# Patient Record
Sex: Female | Born: 1995 | Race: White | Hispanic: No | Marital: Single | Smoking: Never smoker
Health system: Southern US, Community
[De-identification: ages and names within clinical notes are randomized; demographics above are authoritative.]

## PROBLEM LIST (undated history)

## (undated) DIAGNOSIS — F419 Anxiety disorder, unspecified: Secondary | ICD-10-CM

## (undated) DIAGNOSIS — F32A Depression, unspecified: Secondary | ICD-10-CM

## (undated) DIAGNOSIS — F329 Major depressive disorder, single episode, unspecified: Secondary | ICD-10-CM

## (undated) HISTORY — PX: NO PAST SURGERIES: SHX2092

---

## 2017-02-02 ENCOUNTER — Emergency Department
Admission: EM | Admit: 2017-02-02 | Discharge: 2017-02-02 | Disposition: A | Discharge status: Home / Self Care | Payer: BLUE CROSS/BLUE SHIELD | Attending: Emergency Medicine | Admitting: Emergency Medicine

## 2017-02-02 ENCOUNTER — Other Ambulatory Visit: Payer: Self-pay

## 2017-02-02 ENCOUNTER — Inpatient Hospital Stay
Admission: AD | Admit: 2017-02-02 | Discharge: 2017-02-03 | DRG: 882 | Disposition: A | Discharge status: Home / Self Care | Payer: BLUE CROSS/BLUE SHIELD | Attending: Psychiatry | Admitting: Psychiatry

## 2017-02-02 DIAGNOSIS — F419 Anxiety disorder, unspecified: Secondary | ICD-10-CM | POA: Diagnosis present

## 2017-02-02 DIAGNOSIS — F329 Major depressive disorder, single episode, unspecified: Secondary | ICD-10-CM | POA: Diagnosis present

## 2017-02-02 DIAGNOSIS — F4310 Post-traumatic stress disorder, unspecified: Secondary | ICD-10-CM | POA: Diagnosis present

## 2017-02-02 DIAGNOSIS — Z23 Encounter for immunization: Secondary | ICD-10-CM | POA: Diagnosis not present

## 2017-02-02 DIAGNOSIS — F332 Major depressive disorder, recurrent severe without psychotic features: Secondary | ICD-10-CM | POA: Diagnosis present

## 2017-02-02 DIAGNOSIS — R45851 Suicidal ideations: Secondary | ICD-10-CM | POA: Diagnosis present

## 2017-02-02 DIAGNOSIS — Z915 Personal history of self-harm: Secondary | ICD-10-CM | POA: Diagnosis not present

## 2017-02-02 DIAGNOSIS — Z818 Family history of other mental and behavioral disorders: Secondary | ICD-10-CM

## 2017-02-02 DIAGNOSIS — X789XXA Intentional self-harm by unspecified sharp object, initial encounter: Secondary | ICD-10-CM

## 2017-02-02 DIAGNOSIS — Z8659 Personal history of other mental and behavioral disorders: Secondary | ICD-10-CM | POA: Diagnosis not present

## 2017-02-02 DIAGNOSIS — R4589 Other symptoms and signs involving emotional state: Secondary | ICD-10-CM | POA: Diagnosis present

## 2017-02-02 DIAGNOSIS — S61519A Laceration without foreign body of unspecified wrist, initial encounter: Secondary | ICD-10-CM

## 2017-02-02 HISTORY — DX: Major depressive disorder, single episode, unspecified: F32.9

## 2017-02-02 HISTORY — DX: Anxiety disorder, unspecified: F41.9

## 2017-02-02 HISTORY — DX: Depression, unspecified: F32.A

## 2017-02-02 LAB — URINE DRUG SCREEN, QUALITATIVE (ARMC ONLY)
AMPHETAMINES, UR SCREEN: NOT DETECTED
Barbiturates, Ur Screen: NOT DETECTED
Benzodiazepine, Ur Scrn: NOT DETECTED
COCAINE METABOLITE, UR ~~LOC~~: NOT DETECTED
Cannabinoid 50 Ng, Ur ~~LOC~~: POSITIVE — AB
MDMA (ECSTASY) UR SCREEN: NOT DETECTED
METHADONE SCREEN, URINE: NOT DETECTED
OPIATE, UR SCREEN: NOT DETECTED
Phencyclidine (PCP) Ur S: NOT DETECTED
Tricyclic, Ur Screen: NOT DETECTED

## 2017-02-02 LAB — POCT PREGNANCY, URINE: Preg Test, Ur: NEGATIVE

## 2017-02-02 LAB — CBC
HEMATOCRIT: 40.9 % (ref 35.0–47.0)
HEMOGLOBIN: 13.6 g/dL (ref 12.0–16.0)
MCH: 28.5 pg (ref 26.0–34.0)
MCHC: 33.4 g/dL (ref 32.0–36.0)
MCV: 85.4 fL (ref 80.0–100.0)
Platelets: 191 10*3/uL (ref 150–440)
RBC: 4.78 MIL/uL (ref 3.80–5.20)
RDW: 13.7 % (ref 11.5–14.5)
WBC: 7 10*3/uL (ref 3.6–11.0)

## 2017-02-02 LAB — COMPREHENSIVE METABOLIC PANEL
ALBUMIN: 4.4 g/dL (ref 3.5–5.0)
ALK PHOS: 46 U/L (ref 38–126)
ALT: 12 U/L — ABNORMAL LOW (ref 14–54)
ANION GAP: 9 (ref 5–15)
AST: 19 U/L (ref 15–41)
BUN: 10 mg/dL (ref 6–20)
CALCIUM: 9.6 mg/dL (ref 8.9–10.3)
CO2: 23 mmol/L (ref 22–32)
Chloride: 106 mmol/L (ref 101–111)
Creatinine, Ser: 0.73 mg/dL (ref 0.44–1.00)
GFR calc Af Amer: >60 mL/min (ref >60)
GFR calc non Af Amer: >60 mL/min (ref >60)
GLUCOSE: 91 mg/dL (ref 65–99)
POTASSIUM: 3.6 mmol/L (ref 3.5–5.1)
SODIUM: 138 mmol/L (ref 135–145)
Total Bilirubin: 0.9 mg/dL (ref 0.3–1.2)
Total Protein: 7.6 g/dL (ref 6.5–8.1)

## 2017-02-02 LAB — SALICYLATE LEVEL: Salicylate Lvl: <7 mg/dL (ref 2.8–30.0)

## 2017-02-02 LAB — ACETAMINOPHEN LEVEL

## 2017-02-02 LAB — ETHANOL: Alcohol, Ethyl (B): <10 mg/dL (ref <10)

## 2017-02-02 MED ORDER — TRAZODONE HCL 100 MG PO TABS
100.0000 mg | ORAL_TABLET | Freq: Every evening | ORAL | Status: DC | PRN
  Administered 2017-02-02: 100 mg via ORAL
  Filled 2017-02-02: qty 1

## 2017-02-02 MED ORDER — ACETAMINOPHEN 325 MG PO TABS: 650.0000 mg | ORAL_TABLET | Freq: Four times a day (QID) | ORAL | Status: DC | PRN

## 2017-02-02 MED ORDER — ALUM & MAG HYDROXIDE-SIMETH 200-200-20 MG/5ML PO SUSP: 30.0000 mL | ORAL | Status: DC | PRN

## 2017-02-02 MED ORDER — HYDROXYZINE HCL 50 MG PO TABS: 50.0000 mg | ORAL_TABLET | ORAL | Status: DC | PRN

## 2017-02-02 MED ORDER — MAGNESIUM HYDROXIDE 400 MG/5ML PO SUSP: 30.0000 mL | Freq: Every day | ORAL | Status: DC | PRN

## 2017-02-02 MED ORDER — INFLUENZA VAC SPLIT QUAD 0.5 ML IM SUSY
0.5000 mL | PREFILLED_SYRINGE | INTRAMUSCULAR | Status: AC
  Administered 2017-02-03: 0.5 mL via INTRAMUSCULAR
  Filled 2017-02-02: qty 0.5

## 2017-02-02 MED ORDER — DIAZEPAM 5 MG PO TABS
10.0000 mg | ORAL_TABLET | Freq: Once | ORAL | Status: AC
  Administered 2017-02-02: 10 mg via ORAL
  Filled 2017-02-02: qty 2

## 2017-02-02 NOTE — BH Assessment (Signed)
Patient [facility] by Dr. Toni Amendlapacs.  Attending Physician will be Dr. Mikey CollegeMcKnew.   Patient has been assigned to room [facility]A, [facility] Charge Nurse Dedra SkeensGwen F..   Intake Paper Work has been signed and placed on patient chart.  ER staff is aware of the admission Lewie Loron(Emilie, ER Sect.; Dr. Derrill KayGoodman, ER MD; Murlean IbaAmy T., Patient's Nurse & Mertie ClauseJeanelle Patient Access).
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
## 2017-02-02 NOTE — ED Notes (Signed)
Pt discharged - her disposition was to [facility] room 303  - I entered home/self care by accident
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
## 2017-02-02 NOTE — ED Provider Notes (Signed)
[facility] Emergency Department Provider Note       Time seen: ----------------------------------------- 12:00 PM on 02/02/2017 -----------------------------------------    I have reviewed the triage vital signs and the nursing notes.  HISTORY   Chief Complaint Suicidal    HPI Tara Farmer is a 21 y.o. female with no significant past medical history who presents to the ED for suicidal ideation.  Patient had plan to take her roommates medication and overdose.  She does report a history of depression and anxiety along with PTSD.  She denies any homicidal ideation.  She currently has suicidal feelings.  No past medical history on file.  There are no active problems to display for this patient.   No past surgical history on file.  Allergies Patient has no known allergies.  Social History Social History   Tobacco Use  . Smoking status: Not on file  Substance Use Topics  . Alcohol use: Not on file  . Drug use: Not on file    Review of Systems Constitutional: Negative for fever. Cardiovascular: Negative for chest pain. Respiratory: Negative for shortness of breath. Gastrointestinal: Negative for abdominal pain, vomiting and diarrhea. Genitourinary: Negative for dysuria. Musculoskeletal: Negative for back pain. Skin: Negative for rash. Neurological: Negative for headaches, focal weakness or numbness. Psychiatric: Positive for suicidal ideation  All systems negative/normal/unremarkable except as stated in the HPI  ____________________________________________   PHYSICAL EXAM:  VITAL SIGNS: ED Triage Vitals  Enc Vitals Group     BP 02/02/17 1019 133/82     Pulse Rate 02/02/17 1019 75     Resp 02/02/17 1019 17     Temp 02/02/17 1019 97.7 F (36.5 C)     Temp Source 02/02/17 1019 Oral     SpO2 02/02/17 1019 99 %     Weight 02/02/17 1020 150 lb (68 kg)     Height 02/02/17 1020 5\' 3"  (1.6 m)     Head Circumference --      Peak Flow  --      Pain Score --      Pain Loc --      Pain Edu? --      Excl. in GC? --     Constitutional: Alert and oriented. Well appearing and in no distress. Eyes: Conjunctivae are normal. Normal extraocular movements. Cardiovascular: Normal rate, regular rhythm. No murmurs, rubs, or gallops. Respiratory: Normal respiratory effort without tachypnea nor retractions. Breath sounds are clear and equal bilaterally. No wheezes/rales/rhonchi. Gastrointestinal: Soft and nontender. Normal bowel sounds Musculoskeletal: Nontender with normal range of motion in extremities. No lower extremity tenderness nor edema. Neurologic:  Normal speech and language. No gross focal neurologic deficits are appreciated.  Skin:  Skin is warm, dry and intact. No rash noted. Psychiatric: Depressed mood and affect ____________________________________________  ED COURSE:  Pertinent labs & imaging results that were available during my care of the patient were reviewed by me and considered in my medical decision making (see chart for details). Patient presents for depression and suicidal ideation, we will assess with labs and imaging as indicated.   Procedures ____________________________________________   LABS (pertinent positives/negatives)  Labs Reviewed  COMPREHENSIVE METABOLIC PANEL - Abnormal; Notable for the following components:      Result Value   ALT 12 (*)    All other components within normal limits  ACETAMINOPHEN LEVEL - Abnormal; Notable for the following components:   Acetaminophen (Tylenol), Serum <10 (*)    All other components within normal limits  URINE DRUG  SCREEN, QUALITATIVE ([facility] ONLY) - Abnormal; Notable for the following components:   Cannabinoid 50 Ng, Ur [place] POSITIVE (*)    All other components within normal limits  ETHANOL  SALICYLATE LEVEL  CBC  POCT PREGNANCY, URINE  POC URINE PREG, ED   ____________________________________________  DIFFERENTIAL DIAGNOSIS   Depression,  suicidal ideation, pregnancy, medication noncompliance, drug abuse  FINAL ASSESSMENT AND PLAN  Depression, suicidal ideation   Plan: Patient had presented for depression and suicidal thought. Patient's labs are reassuring and she is medically stable for psychiatric evaluation and disposition.   Emily FilbertWilliams, Samiyah Stupka E, MD   Note: This note was generated in part or whole with voice recognition software. Voice recognition is usually quite accurate but there are transcription errors that can and very often do occur. I apologize for any typographical errors that were not detected and corrected.     Emily FilbertWilliams, Carlene Bickley E, MD 02/02/17 [phone]

## 2017-02-02 NOTE — BH Assessment (Signed)
Writer received a phone a call from patient's mother Marylene Land(Angela [phone]) requesting information about the patient. Writer informed her about HIPPA and stated he would have to call her back.  Writer spoke with patient to receive permission to talk with her mother.  Writer called the mother back and informed her, patient is to [facility]. She said she needed to talked with the physician, who she spoke with earlier today. Writer asked if it was something he could help with because may be gone for the day. Mother said no, she whether talk with him because "things have changed. I really need to talk with him."  Writer spoke with Psych MD (Dr. Toni Amendlapacs) and informed him of the conversation.

## 2017-02-02 NOTE — Plan of Care (Signed)
New admission

## 2017-02-02 NOTE — ED Triage Notes (Signed)
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

## 2017-02-02 NOTE — ED Notes (Signed)
BEHAVIORAL HEALTH ROUNDING Patient sleeping: No. Patient alert and oriented: yes Behavior appropriate: Yes.  ; If no, describe:  Nutrition and fluids offered: yes Toileting and hygiene offered: Yes  Sitter present: q15 minute observations and security  monitoring Law enforcement present: Yes  ODS  

## 2017-02-02 NOTE — ED Notes (Signed)

## 2017-02-02 NOTE — [facility] Group Notes (Signed)
[facility] Group Notes:  (Nursing/MHT/Case Management/Adjunct)  Date:  02/02/2017  Time:  9:18 PM  Type of Therapy:  Group Therapy  Participation Level:  Active  Participation Quality:  Appropriate  Affect:  Appropriate  Cognitive:  Alert  Insight:  Good  Engagement in Group:  Engaged  Modes of Intervention:  Support  Summary of Progress/Problems:  Tara Farmer 02/02/2017, 9:18 PM

## 2017-02-02 NOTE — BH Assessment (Signed)
Assessment Note  Tara PrimroseGabrielle Farmer is an 21 y.o. female who presents to the ER due to having thoughts of ending her life. Patient states she has felt this way for several days. On today (02/02/2017), she was in class and had the thought of overdoing on medications. She had the plan of using her roommate's medications. Patient is currently a Consulting civil engineerstudent at [employer] and state her classes have been a stressor for her. Patient has a history of serious suicide attempts. Including overdosing on medications. She denies ever receiving outpatient treatment.  Patient admits to using alcohol and cannabis.  Patient denies HI and AV/H.  Diagnosis: MDD  Past Medical History: No past medical history on file.  No past surgical history on file.  Family History: No family history on file.  Social History:  has no tobacco, alcohol, and drug history on file.  Additional Social History:  Alcohol / Drug Use Pain Medications: See PTA Prescriptions: See PTA Over the Counter: See PTA History of alcohol / drug use?: Yes Longest period of sobriety (when/how long): Unable to quantify Substance #1 Name of Substance 1: Alcohol Substance #2 Name of Substance 2: Cannabis  CIWA: CIWA-Ar BP: 133/82 Pulse Rate: 75 COWS:    Allergies: No Known Allergies  Home Medications:  (Not in a hospital admission)  OB/GYN Status:  Patient's last menstrual period was 01/30/2017.  General Assessment Data Assessment unable to be completed: Yes [facility] Assessment: In system Is this a Tele or Face-to-Face Assessment?: Face-to-Face Is this an Initial Assessment or a Re-assessment for this encounter?: Initial Assessment Marital status: Single Maiden name: n/a Is patient pregnant?: No Living Arrangements: Other (Comment)(School) Can pt return to current living arrangement?: Yes Admission Status: Involuntary Is patient capable of signing voluntary admission?: No(Under IVC) Referral Source:  Self/Family/Friend Insurance type: Unknown  Medical Screening Exam [facility] Walk-in ONLY) Medical Exam completed: Yes  Crisis Care Plan Living Arrangements: Other (Comment)(School) Legal Guardian: Other:(Self) Name of Psychiatrist: Reports of none Name of Therapist: Reports of none  Education Status Is patient currently in school?: Yes Current Grade: College Highest grade of school patient has completed: Some [employer] Name of school: [employer] Contact person: n/a  Risk to self with the past 6 months Suicidal Ideation: Yes-Currently Present Has patient been a risk to self within the past 6 months prior to admission? : Yes Suicidal Intent: Yes-Currently Present Has patient had any suicidal intent within the past 6 months prior to admission? : Yes Is patient at risk for suicide?: Yes Suicidal Plan?: Yes-Currently Present Has patient had any suicidal plan within the past 6 months prior to admission? : Yes Specify Current Suicidal Plan: Overdose Access to Means: Yes Specify Access to Suicidal Means: Medications What has been your use of drugs/alcohol within the last 12 months?: Alcohol & Cananbis Previous Attempts/Gestures: Yes How many times?: 2 Other Self Harm Risks: Reports of none Triggers for Past Attempts: Other (Comment)(History of trauma) Intentional Self Injurious Behavior: None Family Suicide History: No Recent stressful life event(s): Other (Comment)(History of trauma) Persecutory voices/beliefs?: No Depression: Yes Depression Symptoms: Tearfulness, Isolating, Feeling worthless/self pity Substance abuse history and/or treatment for substance abuse?: Yes Suicide prevention information given to non-admitted patients: Not applicable  Risk to Others within the past 6 months Homicidal Ideation: No Does patient have any lifetime risk of violence toward others beyond the six months prior to admission? : No Thoughts of Harm to Others: No Current Homicidal Intent:  No Current Homicidal Plan: No  Access to Homicidal Means: No Identified Victim: Reports of none History of harm to others?: No Assessment of Violence: None Noted Violent Behavior Description: Reports of none Does patient have access to weapons?: No Does patient have a court date: No Is patient on probation?: No  Psychosis Hallucinations: None noted Delusions: None noted  Mental Status Report Appearance/Hygiene: Unremarkable, In scrubs Eye Contact: Good Motor Activity: Freedom of movement, Unremarkable Speech: Logical/coherent Level of Consciousness: Alert Mood: Depressed, Anxious, Sad, Pleasant Affect: Appropriate to circumstance, Depressed, Sad Anxiety Level: Minimal Thought Processes: Coherent, Relevant Judgement: Unimpaired Orientation: Person, Place, Time, Situation, Appropriate for developmental age Obsessive Compulsive Thoughts/Behaviors: Minimal  Cognitive Functioning Concentration: Normal Memory: Recent Intact, Remote Intact IQ: Average Insight: Fair Impulse Control: Fair Appetite: Good Weight Loss: 0 Weight Gain: 0 Sleep: No Change Total Hours of Sleep: 7 Vegetative Symptoms: None  ADLScreening [facility] Assessment Services) Patient's cognitive ability adequate to safely complete daily activities?: Yes Patient able to express need for assistance with ADLs?: Yes Independently performs ADLs?: Yes (appropriate for developmental age)  Prior Inpatient Therapy Prior Inpatient Therapy: No Prior Therapy Dates: n/a Prior Therapy Facilty/Provider(s): n/a Reason for Treatment: n/a  Prior Outpatient Therapy Prior Outpatient Therapy: No Prior Therapy Dates: n/a Prior Therapy Facilty/Provider(s): n/a Reason for Treatment: n/a Does patient have an ACCT team?: No Does patient have Intensive In-House Services?  : No Does patient have [facility] services? : No Does patient have P4CC services?: No  ADL Screening (condition at time of admission) Patient's cognitive ability  adequate to safely complete daily activities?: Yes Is the patient deaf or have difficulty hearing?: No Does the patient have difficulty seeing, even when wearing glasses/contacts?: No Does the patient have difficulty concentrating, remembering, or making decisions?: No Patient able to express need for assistance with ADLs?: Yes Does the patient have difficulty dressing or bathing?: No Independently performs ADLs?: Yes (appropriate for developmental age) Does the patient have difficulty walking or climbing stairs?: No Weakness of Legs: None Weakness of Arms/Hands: None  Home Assistive Devices/Equipment Home Assistive Devices/Equipment: None  Therapy Consults (therapy consults require a physician order) PT Evaluation Needed: No OT Evalulation Needed: No SLP Evaluation Needed: No   Values / Beliefs Cultural Requests During Hospitalization: None Spiritual Requests During Hospitalization: None Consults Spiritual Care Consult Needed: No Social Work Consult Needed: No Merchant navy officerAdvance Directives (For Healthcare) Does Patient Have a Medical Advance Directive?: No    Additional Information 1:1 In Past 12 Months?: No CIRT Risk: No Elopement Risk: No Does patient have medical clearance?: Yes  Child/Adolescent Assessment Running Away Risk: Denies(Patient is an adult)  Disposition:  Disposition Initial Assessment Completed for this Encounter: Yes Disposition of Patient: Pending Review with psychiatrist  On Site Evaluation by:   Reviewed with Physician:    Lilyan Gilfordalvin J. Syndey Jaskolski MS, LCAS, LPC, NCC, CCSI Therapeutic Triage Specialist 02/02/2017 3:32 PM
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
## 2017-02-03 DIAGNOSIS — F4310 Post-traumatic stress disorder, unspecified: Principal | ICD-10-CM

## 2017-02-03 LAB — HEMOGLOBIN A1C
Hgb A1c MFr Bld: 4.9 % (ref 4.8–5.6)
Mean Plasma Glucose: 93.93 mg/dL

## 2017-02-03 LAB — LIPID PANEL
CHOL/HDL RATIO: 3.8 ratio
Cholesterol: 217 mg/dL — ABNORMAL HIGH (ref 0–169)
HDL: 57 mg/dL (ref >40)
LDL CALC: 133 mg/dL — AB (ref 0–99)
Triglycerides: 137 mg/dL (ref <150)
VLDL: 27 mg/dL (ref 0–40)

## 2017-02-03 LAB — TSH: TSH: 1.608 u[IU]/mL (ref 0.350–4.500)
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
## 2017-02-03 NOTE — Progress Notes (Addendum)
  [facility] Discharge Plan :  Will you be returning to the same living situation after discharge:  Yes,  will return to campus today, then go to family for Thanksgiving holiday At discharge, do you have transportation home?: Yes,  friend and father Do you have the ability to pay for your medications: Yes,  no concerns expressed  Release of information consent forms completed and in the chart;  Patient's signature needed at discharge.  Patient to Follow up at: Follow-up Information    [facility] Follow up on 03/01/2017.   Why:  Initial appointment for therapy 12/11 at 11 AM.  Please call to cancel/reschedule. This the earliest appointment available.   Contact information: [address]  [address] [place], [place] [id number] Phone:  [phone] Fax:  [phone]       [facility] Follow up on 02/22/2017.   Specialty:  Behavioral Health Why:  Initial appointment for psychiatric evaluation on 12/4 at 9 AM.  Please arrive 30 minutes early for paperwork.  Please call to cancel/reschedule if needed. Appointment scheduled for after patient's return to campus after Thanksgiving break.  Contact information: [address] [facility] [place] [id number] [phone]       [facility] Follow up.   Why:  Please use crisis walk in if needed, if experiencing any life emergency Monday - Friday from 8 AM - 5 PM.  After hours, call campus security at [phone] to be connected to the counselor on call.    Contact information: Campus [address],  [place], [place] [address] Phone: [phone] Fax: [phone]          Next level of care provider has access to [facility] Link:no  Safety Planning and Suicide Prevention discussed: Yes,  w mother Marylee Florasngie Plamondon  Have you used any form of tobacco in the last 30 days? (Cigarettes, Smokeless Tobacco, Cigars, and/or Pipes): No  Has patient  been referred to the Quitline?: N/A patient is not a smoker  Patient has been referred for addiction treatment: N/A Outpatient providers will also assess need for referrals  Patient informed by VM of new therapy appt, CSW also gave information to mother.    Sallee LangeAnne C Cunningham, LCSW 02/03/2017, 2:54 PM

## 2017-02-03 NOTE — Plan of Care (Signed)
Tara IvoryGabrielle reports to this Clinical research associatewriter that she is not have thoughts of harming herself at this time. Denies HI or AVH. Tearful during interaction, having just gotten off the phone with her mother. "I'm really ready to go home".

## 2017-02-03 NOTE — Progress Notes (Signed)
Patient ID: Tara PrimroseGabrielle Farmer, female   DOB: 12/03/1995, 21 y.o.   MRN: [id number] "Stressed out about school workloads and Cheerleading activities, trying to balance my social life and keeping the grades; I have suicidal thoughts (about cutting myself) this morning before they brought me here; I also have h/o PTSD..." Allowed to vent, encouraged to focus here and now, encouraged to focus about her goals as defined by her. Denied pain, pleasant, interacting and adjusting well the new environment; Trazodone 100 mg PO, PRN given at bedtime.
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
## 2017-02-03 NOTE — Progress Notes (Signed)
Discharge note: Patient educated about follow up care, upcoming appointments reviewed. Patient verbalizes understanding of all follow up appointments. Transition record, AVS, and Suicide risk assessment reviewed. Patient expresses no concerns or questions at this time. Patient provided with influenza vaccine information. Patient is being discharged without prescriptions or medications at this time. Patient belongings returned. Patient denies SI, HI, AVH at this time. Educated patient about suicide help resources and hotline, encouraged to call for assistance in the event of a crisis. Patient agrees. Expresses excitement about returning to hometown for upcoming holiday celebrations. Patient is ambulatory and safe at time of discharge. Patient discharged to college friend [facility] lobby at this time.

## 2017-02-03 NOTE — BHH Group Notes (Signed)
B[facility] LCSW Group Therapy Note  Date/Time: 02/03/17, 0930  Type of Therapy/Topic:  Group Therapy:  Balance in Life  Participation Level:  minimal  Description of Group:    This group will address the concept of balance and how it feels and looks when one is unbalanced. Patients will be encouraged to process areas in their lives that are out of balance, and identify reasons for remaining unbalanced. Facilitators will guide patients utilizing problem- solving interventions to address and correct the stressor making their life unbalanced. Understanding and applying boundaries will be explored and addressed for obtaining  and maintaining a balanced life. Patients will be encouraged to explore ways to assertively make their unbalanced needs known to significant others in their lives, using other group members and facilitator for support and feedback.  Therapeutic Goals: 1. Patient will identify two or more emotions or situations they have that consume much of in their lives. 2. Patient will identify signs/triggers that life has become out of balance:  3. Patient will identify two ways to set boundaries in order to achieve balance in their lives:  4. Patient will demonstrate ability to communicate their needs through discussion and/or role plays  Summary of Patient Progress:Pt identified mental/emotional and school as areas that are out of balance in her life.  Pt only responded to CSW questions during group and did not make other contributions to the group discussion.          Therapeutic Modalities:   Cognitive Behavioral Therapy Solution-Focused Therapy Assertiveness Training  Daleen SquibbGreg Shakir Petrosino, [place]

## 2017-02-03 NOTE — B[facility] Group Notes (Signed)
Goals Group Date/Time: 02/03/2017 9:00 AM Type of Therapy and Topic: Group Therapy: Goals Group: SMART Goals   Participation Level: Moderate  Description of Group:    The purpose of a daily goals group is to assist and guide patients in setting recovery/wellness-related goals. The objective is to set goals as they relate to the crisis in which they were admitted. Patients will be using SMART goal modalities to set measurable goals. Characteristics of realistic goals will be discussed and patients will be assisted in setting and processing how one will reach their goal. Facilitator will also assist patients in applying interventions and coping skills learned in psycho-education groups to the SMART goal and process how one will achieve defined goal.   Therapeutic Goals:   -Patients will develop and document one goal related to or their crisis in which brought them into treatment.  -Patients will be guided by LCSW using SMART goal setting modality in how to set a measurable, attainable, realistic and time sensitive goal.  -Patients will process barriers in reaching goal.  -Patients will process interventions in how to overcome and successful in reaching goal.   Patient's Goal:Keep my anxiety under control.   Therapeutic Modalities:  Motivational Interviewing  Research officer, political partyCognitive Behavioral Therapy  Crisis Intervention Model  SMART goals setting   Daleen SquibbGreg Jordyn Doane, [place]

## 2017-02-03 NOTE — Plan of Care (Signed)
Patient slept for Estimated Hours of 7.45; Precautionary checks every 15 minutes for safety maintained, room free of safety hazards, patient sustains no injury or falls during this shift.  
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
## 2017-02-22 ENCOUNTER — Other Ambulatory Visit: Payer: Self-pay

## 2017-02-22 ENCOUNTER — Ambulatory Visit: Payer: BLUE CROSS/BLUE SHIELD | Admitting: Psychiatry

## 2017-02-22 ENCOUNTER — Encounter: Payer: Self-pay | Admitting: Psychiatry

## 2017-02-22 VITALS — BP 112/75 | HR 86 | Temp 97.5°F | Wt 156.2 lb

## 2017-02-22 DIAGNOSIS — F411 Generalized anxiety disorder: Secondary | ICD-10-CM | POA: Diagnosis not present

## 2017-02-22 DIAGNOSIS — F4310 Post-traumatic stress disorder, unspecified: Secondary | ICD-10-CM | POA: Diagnosis not present

## 2017-02-22 DIAGNOSIS — F332 Major depressive disorder, recurrent severe without psychotic features: Secondary | ICD-10-CM

## 2017-02-22 MED ORDER — SERTRALINE HCL 25 MG PO TABS: 25.0000 mg | ORAL_TABLET | Freq: Every day | ORAL | 2 refills | Status: AC

## 2017-02-22 MED ORDER — TRAZODONE HCL 50 MG PO TABS: 50.0000 mg | ORAL_TABLET | Freq: Every day | ORAL | 1 refills | Status: AC
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
## 2017-03-03 ENCOUNTER — Ambulatory Visit: Payer: BLUE CROSS/BLUE SHIELD | Admitting: Psychiatry

## 2017-04-23 ENCOUNTER — Other Ambulatory Visit: Payer: Self-pay | Admitting: Psychiatry

## 2017-05-27 ENCOUNTER — Other Ambulatory Visit: Payer: Self-pay | Admitting: Psychiatry

## 2017-06-08 ENCOUNTER — Emergency Department: Payer: BLUE CROSS/BLUE SHIELD

## 2017-06-08 ENCOUNTER — Emergency Department
Admission: EM | Admit: 2017-06-08 | Discharge: 2017-06-08 | Disposition: A | Discharge status: Home / Self Care | Payer: BLUE CROSS/BLUE SHIELD | Attending: Emergency Medicine | Admitting: Emergency Medicine

## 2017-06-08 ENCOUNTER — Other Ambulatory Visit: Payer: Self-pay

## 2017-06-08 DIAGNOSIS — Z79899 Other long term (current) drug therapy: Secondary | ICD-10-CM | POA: Insufficient documentation

## 2017-06-08 DIAGNOSIS — F419 Anxiety disorder, unspecified: Secondary | ICD-10-CM | POA: Diagnosis not present

## 2017-06-08 DIAGNOSIS — F329 Major depressive disorder, single episode, unspecified: Secondary | ICD-10-CM | POA: Insufficient documentation

## 2017-06-08 DIAGNOSIS — R1110 Vomiting, unspecified: Secondary | ICD-10-CM | POA: Diagnosis present

## 2017-06-08 DIAGNOSIS — J111 Influenza due to unidentified influenza virus with other respiratory manifestations: Secondary | ICD-10-CM | POA: Diagnosis not present

## 2017-06-08 MED ORDER — ONDANSETRON 4 MG PO TBDP
4.0000 mg | ORAL_TABLET | Freq: Once | ORAL | Status: AC
  Administered 2017-06-08: 4 mg via ORAL
  Filled 2017-06-08: qty 1

## 2017-06-08 MED ORDER — ONDANSETRON 4 MG PO TBDP: 4.0000 mg | ORAL_TABLET | Freq: Three times a day (TID) | ORAL | 0 refills | Status: AC | PRN

## 2017-06-08 NOTE — ED Triage Notes (Addendum)
Pt reports that she was recently dx with flu. Had coughing until emesis X 1 today which had dark red blood in it. Denies GI bleeding. Pt alert and oriented X4, active, cooperative, pt in NAD. RR even and unlabored, color WNL.   Denies NVD currently, denies abdominal pain.

## 2017-06-08 NOTE — ED Provider Notes (Signed)
[facility] Emergency Department Provider Note  ____________________________________________   First MD Initiated Contact with Patient 06/08/17 1924     (approximate)  I have reviewed the triage vital signs and the nursing notes.   HISTORY  Chief Complaint Hemoptysis    HPI Tara Farmer is a 22 y.o. female who presents emergency department eating she had emesis with 1 episode of dark blood.  She states that she was diagnosed with the flu 2 days ago and is been taking Tamiflu.  She has had nausea and vomiting with this medication.  She called the online doctor and they told her to stop taking the Tamiflu.  She states she has had a cough and had some blood in the mucus.  She denies any chest pain or shortness of breath.  She denies diarrhea.  She denies abdominal pain at this time  Past Medical History:  Diagnosis Date  . Anxiety   . Depression     Patient Active Problem List   Diagnosis Date Noted  . PTSD (post-traumatic stress disorder) 02/02/2017  . Severe recurrent major depression without psychotic features (HCC) 02/02/2017  . Self-inflicted laceration of wrist 02/02/2017  . Posttraumatic stress disorder 02/02/2017    Past Surgical History:  Procedure Laterality Date  . NO PAST SURGERIES      Prior to Admission medications   Medication Sig Start Date End Date Taking? Authorizing Provider  Norethindrone Acetate-Ethinyl Estradiol (JUNEL 1.5/30) 1.5-30 MG-MCG tablet TAKE 1 TABLET DAILY 01/06/17   [provider]  ondansetron (ZOFRAN-ODT) 4 MG disintegrating tablet Take 1 tablet (4 mg total) by mouth every 8 (eight) hours as needed for nausea or vomiting. 06/08/17   Sherrie MustacheFisher, Roselyn BeringSusan W, PA-C  sertraline (ZOLOFT) 25 MG tablet Take 1 tablet (25 mg total) by mouth daily. 02/22/17 02/22/18  Patrick Northavi, Himabindu, MD  traZODone (DESYREL) 50 MG tablet Take 1 tablet (50 mg total) by mouth at bedtime. 02/22/17   Patrick Northavi, Himabindu, MD    Allergies Patient  has no known allergies.  Family History  Problem Relation Age of Onset  . Anxiety disorder Mother   . Depression Mother   . Drug abuse Brother   . Depression Brother   . Anxiety disorder Brother     Social History Social History   Tobacco Use  . Smoking status: Never Smoker  . Smokeless tobacco: Never Used  Substance Use Topics  . Alcohol use: Yes    Alcohol/week: 3.6 oz    Types: 4 Standard drinks or equivalent, 2 Glasses of wine per week    Comment: pt states she drinks socially  . Drug use: Yes    Types: Marijuana    Comment: pt states she uses marijuana socially last used 4 weeks ago    Review of Systems  Constitutional: Positive fever/chills Eyes: No visual changes. ENT: No sore throat. Respiratory: Positive cough with one episode of bloody mucus Gastrointestinal: Positive for nausea and vomiting with one episode of dark red blood in the bile Genitourinary: Negative for dysuria. Musculoskeletal: Negative for back pain. Skin: Negative for rash.    ____________________________________________   PHYSICAL EXAM:  VITAL SIGNS: ED Triage Vitals  Enc Vitals Group     BP 06/08/17 1857 123/79     Pulse Rate 06/08/17 1857 84     Resp --      Temp 06/08/17 1857 98.1 F (36.7 C)     Temp Source 06/08/17 1857 Oral     SpO2 06/08/17 1857 94 %  Weight 06/08/17 1859 150 lb (68 kg)     Height 06/08/17 1859 5\' 3"  (1.6 m)     Head Circumference --      Peak Flow --      Pain Score 06/08/17 1859 6     Pain Loc --      Pain Edu? --      Excl. in GC? --     Constitutional: Alert and oriented. Well appearing and in no acute distress.  Is happy and talking with her friend Eyes: Conjunctivae are normal.  Head: Atraumatic. Nose: No congestion/rhinnorhea. Mouth/Throat: Mucous membranes are moist.  Throat appears normal Cardiovascular: Normal rate, regular rhythm.  Heart sounds are normal Respiratory: Normal respiratory effort.  No retractions, lungs clear to  auscultation Abdomen: Is soft, nontender bowel sounds are normal GU: deferred Musculoskeletal: FROM all extremities, warm and well perfused Neurologic:  Normal speech and language.  Skin:  Skin is warm, dry and intact. No rash noted. Psychiatric: Mood and affect are normal. Speech and behavior are normal.  ____________________________________________   LABS (all labs ordered are listed, but only abnormal results are displayed)  Labs Reviewed - No data to display ____________________________________________   ____________________________________________  RADIOLOGY  Chest x-ray is negative for any acute abnormality  ____________________________________________   PROCEDURES  Procedure(s) performed: No  Procedures    ____________________________________________   INITIAL IMPRESSION / ASSESSMENT AND PLAN / ED COURSE  Pertinent labs & imaging results that were available during my care of the patient were reviewed by me and considered in my medical decision making (see chart for details).  Patient is a 22 year old female who was recently diagnosed with the flu.  She had coughing and emesis with some dark red blood earlier today.  She had one episode of vomiting with the same.  She called the online doctor they told her to discontinue the Tamiflu.  She was just concerned that she had lung cancer as that is what she saw on [employer].  On physical exam she appears nontoxic.  Lungs clear to auscultation and abdomen is soft and nontender.  Chest x-ray is negative for any acute abnormality  Spine the chest x-ray results to the patient.  Explained that she may have some irritation of the lining of her throat.  If her symptoms continue on for a few days that she will need to be rechecked by her regular doctor.  If she is worsening she should return to the emergency department.  She states she understands.  She was given a prescription for Zofran ODT for the nausea.  She is to not take  the Tamiflu anymore.  She could take over-the-counter TheraFlu and Tylenol if needed.  She is to drink plenty of fluids.  She was discharged in stable condition     As part of my medical decision making, I reviewed the following data within the electronic MEDICAL RECORD NUMBER Nursing notes reviewed and incorporated, Old chart reviewed, Radiograph reviewed chest x-ray is negative, Notes from prior ED visits and [place] Controlled Substance Database  ____________________________________________   FINAL CLINICAL IMPRESSION(S) / ED DIAGNOSES  Final diagnoses:  Influenza      NEW MEDICATIONS STARTED DURING THIS VISIT:  Discharge Medication List as of 06/08/2017  7:32 PM    START taking these medications   Details  ondansetron (ZOFRAN-ODT) 4 MG disintegrating tablet Take 1 tablet (4 mg total) by mouth every 8 (eight) hours as needed for nausea or vomiting., Starting Wed 06/08/2017, Print  Note:  This document was prepared using Dragon voice recognition software and may include unintentional dictation errors.    Faythe Ghee, PA-C 06/08/17 2019    Phineas Semen, MD 06/08/17 2029

## 2017-06-08 NOTE — Discharge Instructions (Signed)
Follow-up with your regular doctor if not better in 3 days.  Return to emergency department if worsening.  Take over-the-counter TheraFlu and drink plenty of fluids

## 2017-06-08 NOTE — ED Notes (Signed)
FN: [facility]ing [facility] and was also dx with the FLU.

## 2018-05-10 ENCOUNTER — Other Ambulatory Visit: Payer: Self-pay

## 2018-05-10 ENCOUNTER — Encounter: Payer: Self-pay | Admitting: *Deleted

## 2018-05-10 ENCOUNTER — Ambulatory Visit
Admission: EM | Admit: 2018-05-10 | Discharge: 2018-05-10 | Disposition: A | Discharge status: Home / Self Care | Payer: No Typology Code available for payment source | Attending: Emergency Medicine | Admitting: Emergency Medicine

## 2018-05-10 ENCOUNTER — Emergency Department
Admission: EM | Admit: 2018-05-10 | Discharge: 2018-05-10 | Disposition: A | Discharge status: Home / Self Care | Payer: BLUE CROSS/BLUE SHIELD | Attending: Emergency Medicine | Admitting: Emergency Medicine

## 2018-05-10 DIAGNOSIS — T7421XA Adult sexual abuse, confirmed, initial encounter: Secondary | ICD-10-CM | POA: Insufficient documentation

## 2018-05-10 DIAGNOSIS — Y9389 Activity, other specified: Secondary | ICD-10-CM | POA: Insufficient documentation

## 2018-05-10 DIAGNOSIS — S1083XA Contusion of other specified part of neck, initial encounter: Secondary | ICD-10-CM | POA: Insufficient documentation

## 2018-05-10 DIAGNOSIS — Y998 Other external cause status: Secondary | ICD-10-CM | POA: Insufficient documentation

## 2018-05-10 DIAGNOSIS — T148XXA Other injury of unspecified body region, initial encounter: Secondary | ICD-10-CM

## 2018-05-10 DIAGNOSIS — Z0441 Encounter for examination and observation following alleged adult rape: Secondary | ICD-10-CM | POA: Diagnosis present

## 2018-05-10 DIAGNOSIS — Y9289 Other specified places as the place of occurrence of the external cause: Secondary | ICD-10-CM | POA: Diagnosis not present

## 2018-05-10 LAB — POC URINE PREG, ED: Preg Test, Ur: NEGATIVE

## 2018-05-10 MED ORDER — AZITHROMYCIN 500 MG PO TABS
1000.0000 mg | ORAL_TABLET | Freq: Once | ORAL | Status: AC
  Administered 2018-05-10: 1000 mg via ORAL
  Filled 2018-05-10: qty 2

## 2018-05-10 MED ORDER — CEFTRIAXONE SODIUM 250 MG IJ SOLR
250.0000 mg | Freq: Once | INTRAMUSCULAR | Status: AC
  Administered 2018-05-10: 250 mg via INTRAMUSCULAR
  Filled 2018-05-10: qty 250

## 2018-05-10 MED ORDER — LIDOCAINE HCL (PF) 1 % IJ SOLN
0.9000 mL | Freq: Once | INTRAMUSCULAR | Status: AC
  Administered 2018-05-10: 0.9 mL
  Filled 2018-05-10: qty 5

## 2018-05-10 MED ORDER — METRONIDAZOLE 500 MG PO TABS
2000.0000 mg | ORAL_TABLET | Freq: Once | ORAL | Status: AC
  Administered 2018-05-10: 2000 mg via ORAL
  Filled 2018-05-10: qty 4
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
## 2018-05-10 NOTE — Discharge Instructions (Signed)
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 intervals. Do not take your medicine more often than directed. Do not skip doses or stop your medicine early even if you feel better. Do not stop taking except on your doctor's advice. Talk to your pediatrician regarding the use of this medicine in children. Special care may be needed. Overdosage: If you think you have taken too much of this medicine contact a poison control center or emergency room at once. NOTE: This medicine is only for you. Do not share this medicine with others. What if I miss a dose? If you miss a dose, take it as soon as  you can. If it is almost time for your next dose, take only that dose. Do not take double or extra doses. What may interact with this medicine? Do not take this medicine with any of the following medications: -intravenous calcium This medicine may also interact with the following  medications: -birth control pills This list may not describe all possible interactions. Give your health care provider a list of all the medicines, herbs, non-prescription drugs, or dietary supplements you use. Also tell them if you smoke, drink alcohol, or use illegal drugs. Some items may interact with your medicine. What should I watch for while using this medicine? Tell your doctor or health care professional if your symptoms do not improve or if they get worse. Do not treat diarrhea with over the counter products. Contact your doctor if you have diarrhea that lasts more than 2 days or if it is severe and watery. If you are being treated for a sexually transmitted disease, avoid sexual contact until you have finished your treatment. Having sex can infect your sexual partner. Calcium may bind to this medicine and cause lung or kidney problems. Avoid calcium products while taking this medicine and for 48 hours after taking the last dose of this medicine. What side effects may I notice from receiving this medicine? Side effects that you should report to your doctor or health care professional as soon as possible: -allergic reactions like skin rash, itching or hives, swelling of the face, lips, or tongue -breathing problems -fever, chills -irregular heartbeat -pain when passing urine -seizures -stomach pain, cramps -unusual bleeding, bruising -unusually weak or tired Side effects that usually do not require medical attention (report to your doctor or health care professional if they continue or are bothersome): -diarrhea -dizzy, drowsy -headache -nausea, vomiting -pain, swelling, irritation where injected -stomach upset -sweating This list may not describe all possible side effects. Call your doctor for medical advice about side effects. You may report side effects to FDA at 1-800-FDA-1088. Where should I keep my medicine? Keep out of the reach of children. Store at room temperature  below 25 degrees C (77 degrees F). Protect from light. Throw away any unused vials after the expiration date. NOTE: This sheet is a summary. It may not cover all possible information. If you have questions about this medicine, talk to your doctor, pharmacist, or health care provider.  2017 [employer] (2013-09-24 09:14:54)

## 2019-06-11 ENCOUNTER — Other Ambulatory Visit: Payer: Self-pay

## 2019-06-11 ENCOUNTER — Ambulatory Visit
Admission: RE | Admit: 2019-06-11 | Discharge: 2019-06-11 | Disposition: A | Discharge status: Home / Self Care | Payer: BC Managed Care – PPO | Source: Ambulatory Visit | Attending: Family Medicine | Admitting: Family Medicine

## 2019-06-11 ENCOUNTER — Other Ambulatory Visit: Payer: Self-pay | Admitting: Family Medicine

## 2019-06-11 DIAGNOSIS — R52 Pain, unspecified: Secondary | ICD-10-CM | POA: Diagnosis present

## 2019-06-11 DIAGNOSIS — T1490XA Injury, unspecified, initial encounter: Secondary | ICD-10-CM

## 2019-08-15 IMAGING — CR DG CHEST 2V
2 series · 2 of 2 positions shown · non-contrast
Comparison: None.

CLINICAL DATA: Hemoptysis

EXAM:
CHEST - 2 VIEW

[chest pa]
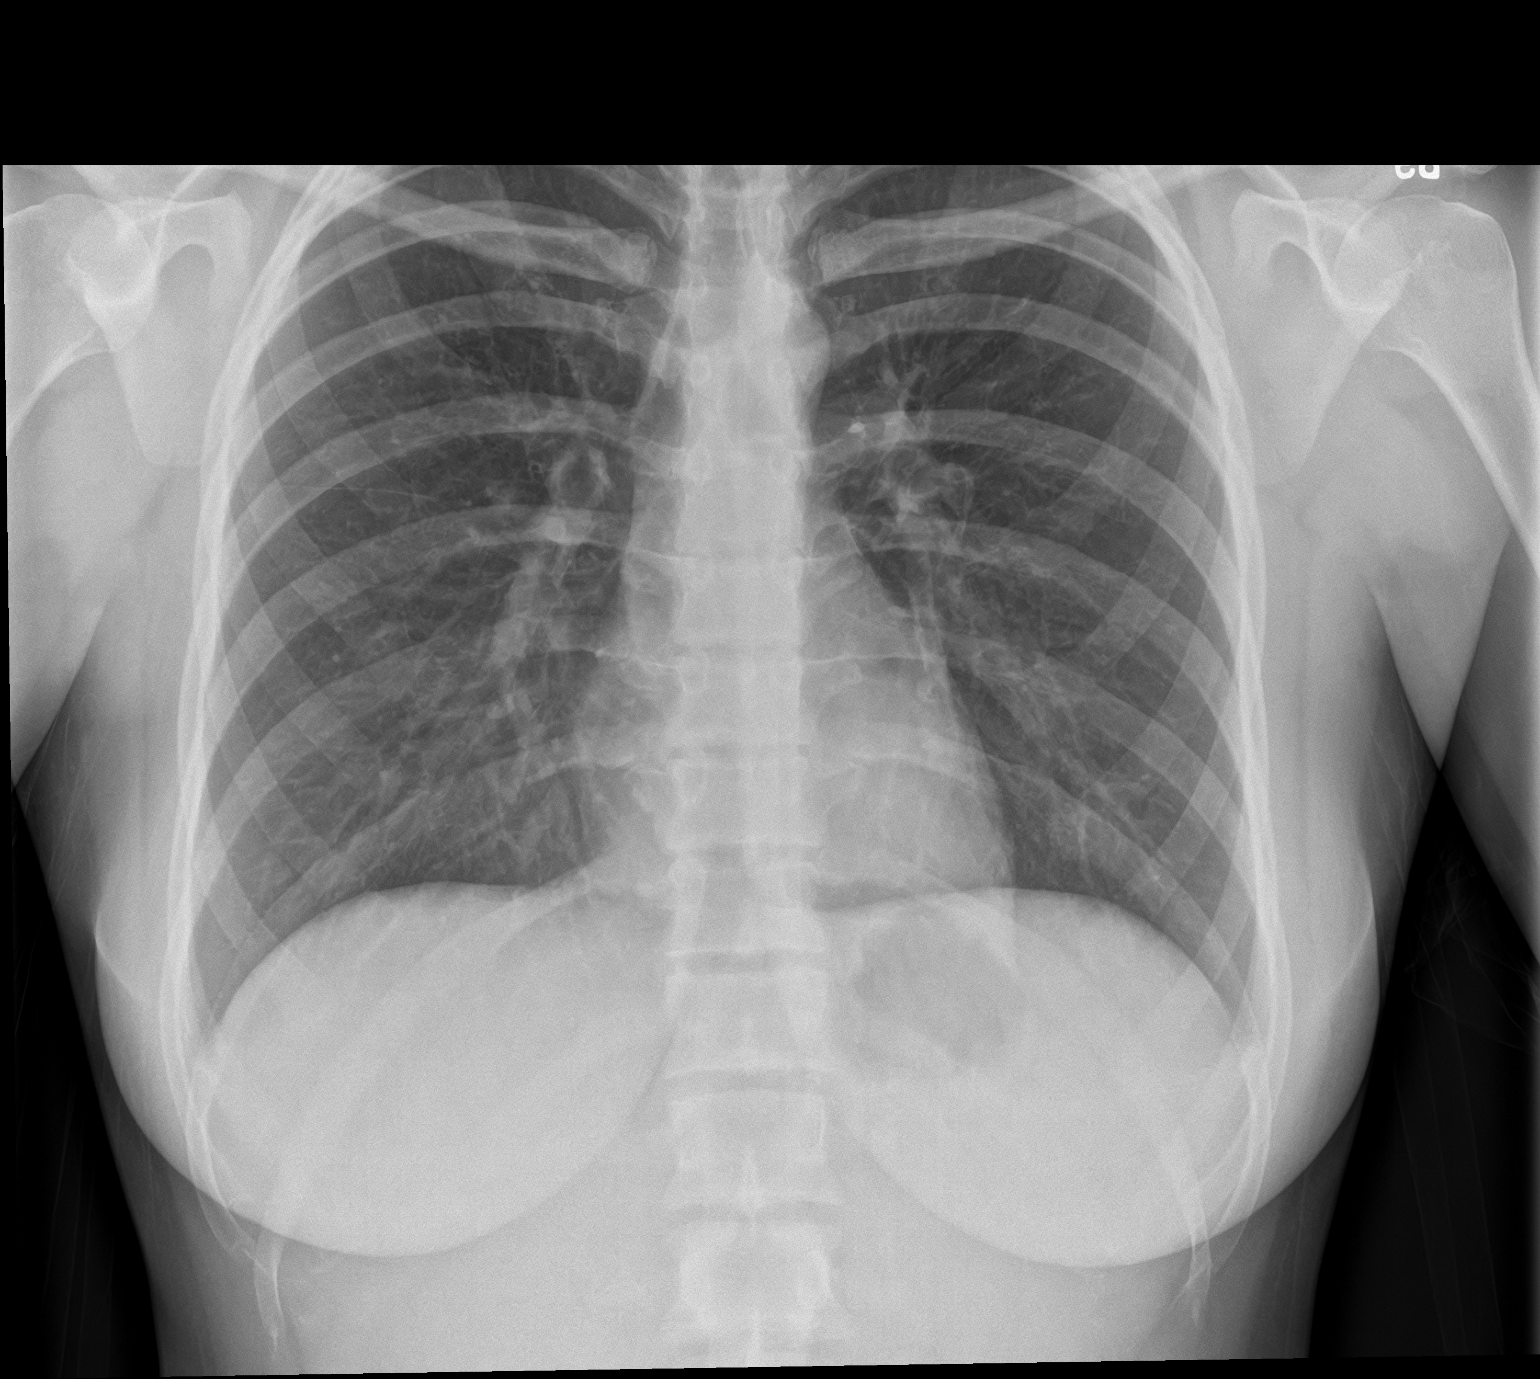

[chest lat]
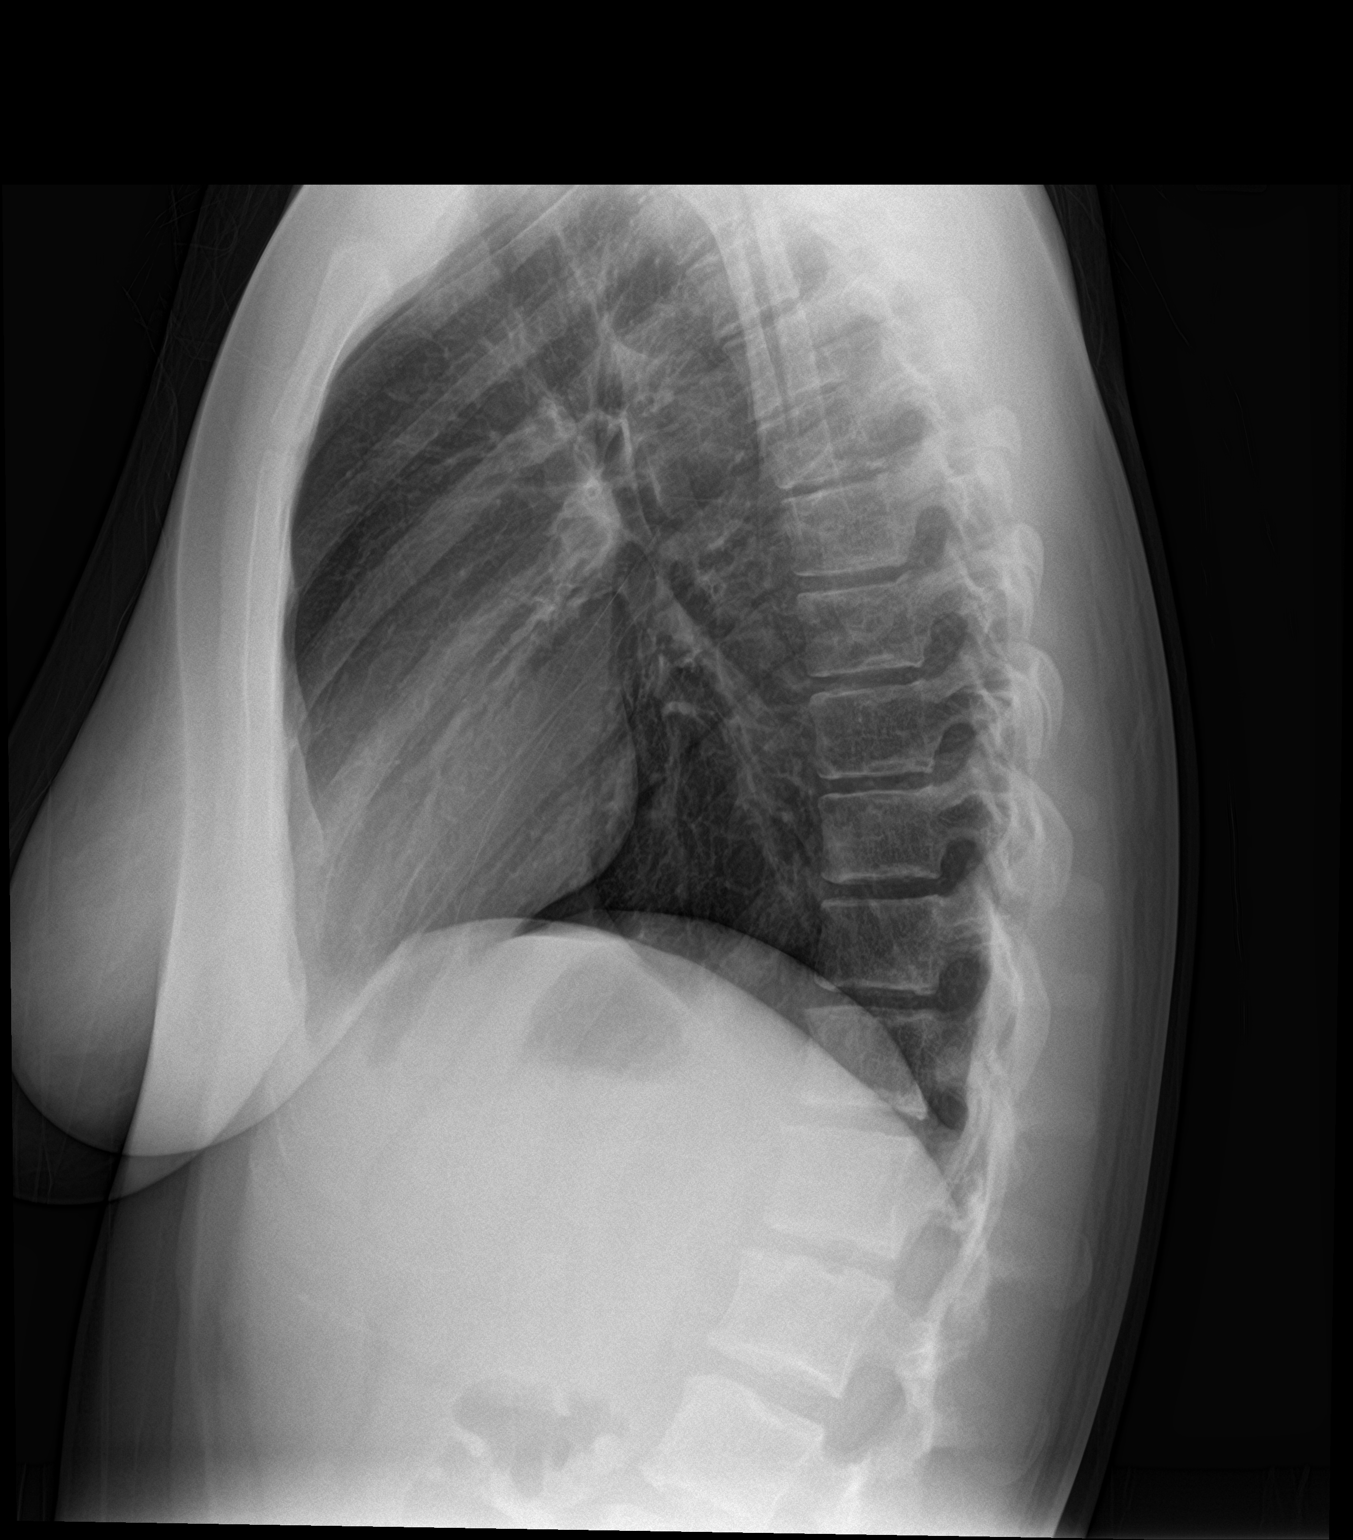

[2 of 2 positions shown; findings below may reference images not displayed]

FINDINGS: The heart size and mediastinal contours are within normal limits.
Both lungs are clear. The visualized skeletal structures are
unremarkable.
IMPRESSION: No active cardiopulmonary disease.

## 2021-08-17 IMAGING — CR DG ANKLE COMPLETE 3+V*L*
1 series · 3 of 3 positions shown · non-contrast
Comparison: None.

CLINICAL DATA: Recent fall with twisting injury 1 week ago, initial
encounter

EXAM:
LEFT ANKLE COMPLETE - 3+ VIEW

[Series 1: dg ankle complete left · 0.14mm/px · 3 of 3 slices shown]
[im 1/3]
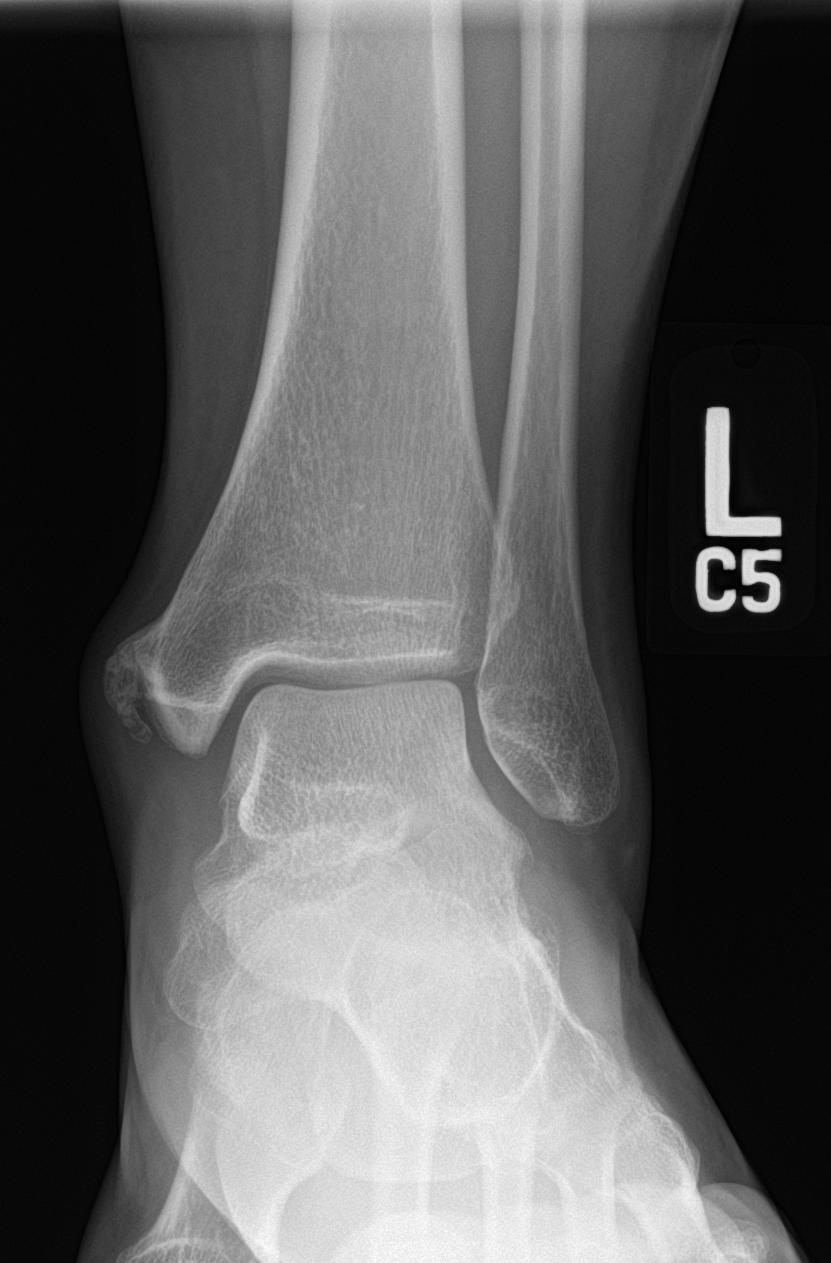
[im 2/3]
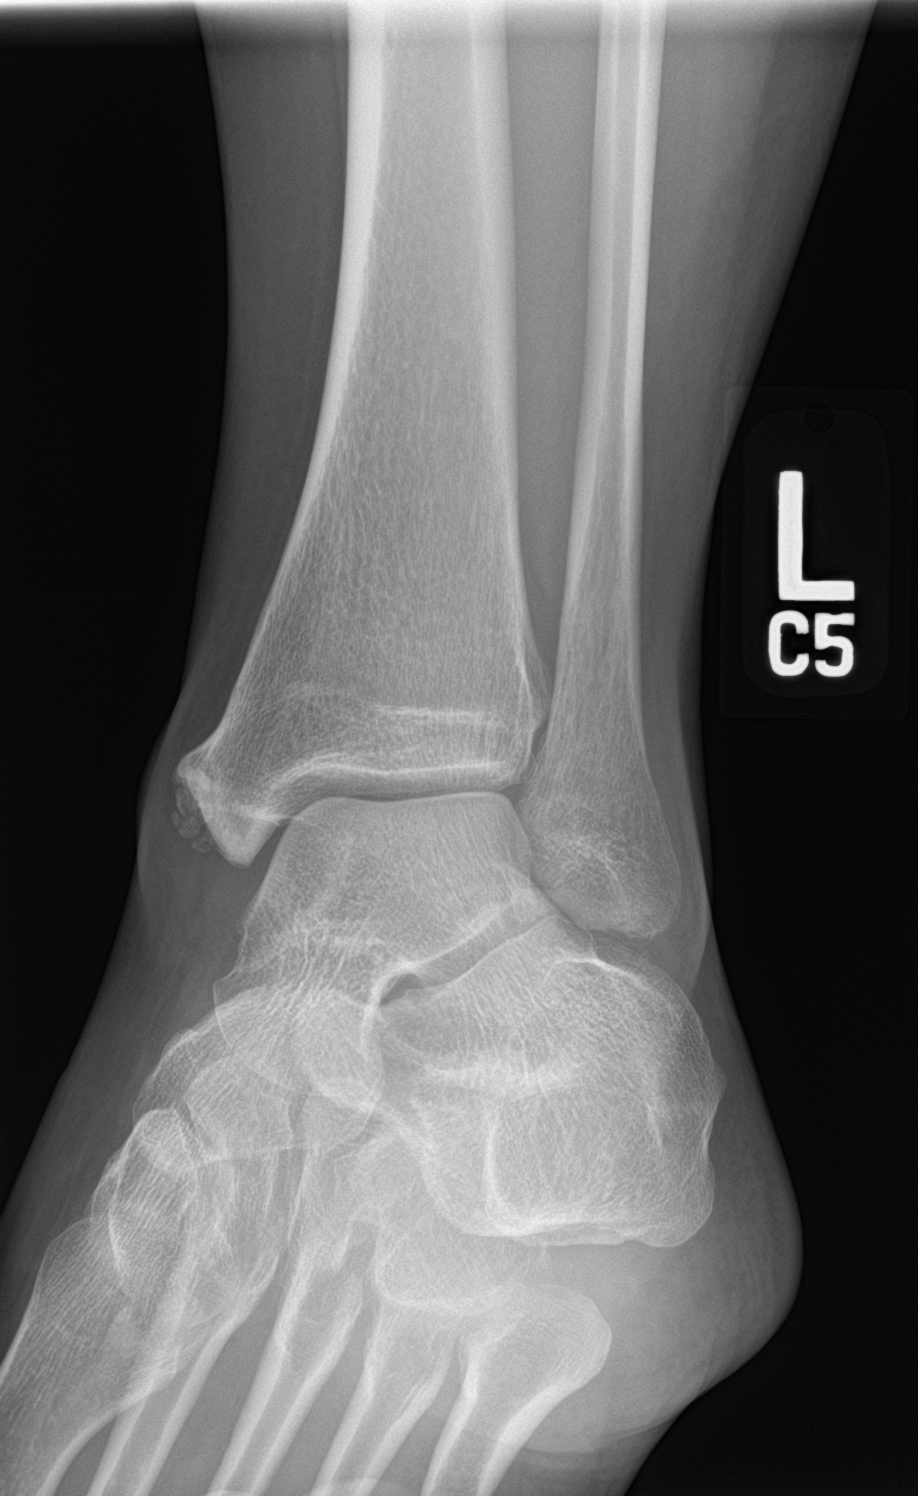
[im 3/3]
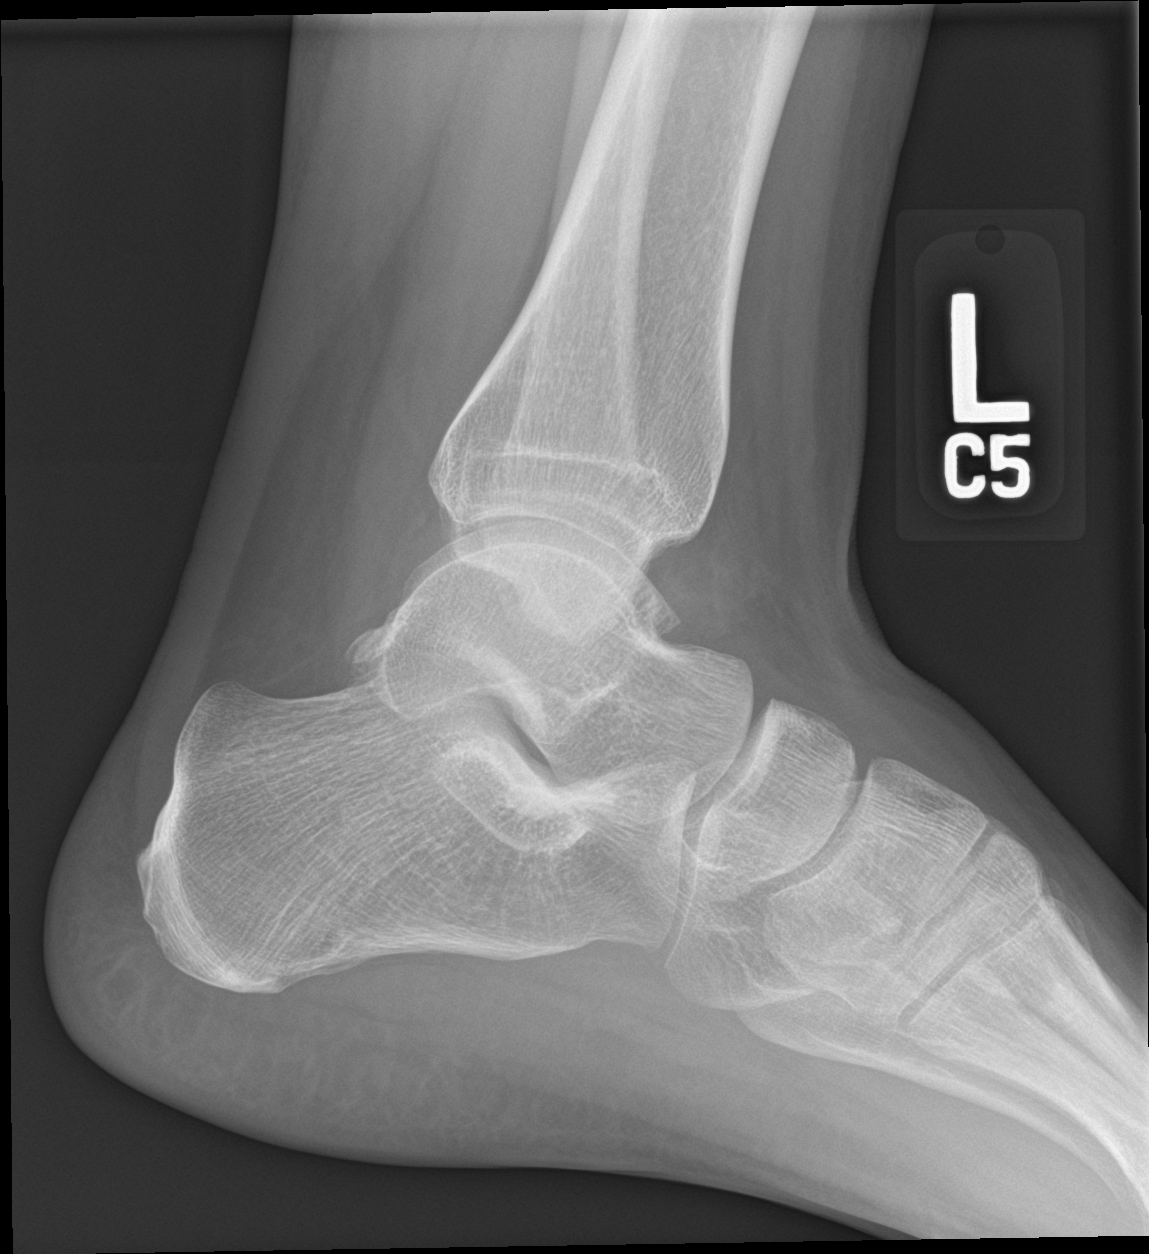

[3 of 3 positions shown; findings below may reference images not displayed]

FINDINGS: No acute fracture or dislocation is noted. Some chronic changes
along the medial malleolus are noted consistent with the known
history of prior fracture. No new abnormality is seen.
IMPRESSION: Chronic posttraumatic changes in the distal tibia. No acute
abnormality is noted.
